# Patient Record
Sex: Female | Born: 1960 | Race: Black or African American | Hispanic: No | Marital: Single | Smoking: Never smoker
Health system: Southern US, Community
[De-identification: ages and names within clinical notes are randomized; demographics above are authoritative.]

## PROBLEM LIST (undated history)

## (undated) HISTORY — PX: ACHILLES TENDON SURGERY: SHX542

---

## 2018-07-16 DIAGNOSIS — Z0000 Encounter for general adult medical examination without abnormal findings: Secondary | ICD-10-CM | POA: Diagnosis not present

## 2018-07-16 DIAGNOSIS — T7840XA Allergy, unspecified, initial encounter: Secondary | ICD-10-CM | POA: Diagnosis not present

## 2018-07-29 DIAGNOSIS — Z1231 Encounter for screening mammogram for malignant neoplasm of breast: Secondary | ICD-10-CM | POA: Diagnosis not present

## 2018-08-04 DIAGNOSIS — Z1211 Encounter for screening for malignant neoplasm of colon: Secondary | ICD-10-CM | POA: Diagnosis not present

## 2018-08-04 DIAGNOSIS — K6389 Other specified diseases of intestine: Secondary | ICD-10-CM | POA: Diagnosis not present

## 2018-08-04 DIAGNOSIS — K641 Second degree hemorrhoids: Secondary | ICD-10-CM | POA: Diagnosis not present

## 2018-08-04 DIAGNOSIS — K5730 Diverticulosis of large intestine without perforation or abscess without bleeding: Secondary | ICD-10-CM | POA: Diagnosis not present

## 2018-09-19 DIAGNOSIS — D649 Anemia, unspecified: Secondary | ICD-10-CM | POA: Diagnosis not present

## 2018-09-19 DIAGNOSIS — K529 Noninfective gastroenteritis and colitis, unspecified: Secondary | ICD-10-CM | POA: Diagnosis not present

## 2019-06-17 DIAGNOSIS — Z1231 Encounter for screening mammogram for malignant neoplasm of breast: Secondary | ICD-10-CM | POA: Diagnosis not present

## 2019-06-17 DIAGNOSIS — R350 Frequency of micturition: Secondary | ICD-10-CM | POA: Diagnosis not present

## 2019-06-25 DIAGNOSIS — R350 Frequency of micturition: Secondary | ICD-10-CM | POA: Diagnosis not present

## 2019-09-03 DIAGNOSIS — Z20828 Contact with and (suspected) exposure to other viral communicable diseases: Secondary | ICD-10-CM | POA: Diagnosis not present

## 2019-09-03 DIAGNOSIS — Z7189 Other specified counseling: Secondary | ICD-10-CM | POA: Diagnosis not present

## 2020-02-06 DIAGNOSIS — L259 Unspecified contact dermatitis, unspecified cause: Secondary | ICD-10-CM | POA: Diagnosis not present

## 2020-02-06 DIAGNOSIS — L590 Erythema ab igne [dermatitis ab igne]: Secondary | ICD-10-CM | POA: Diagnosis not present

## 2020-04-05 ENCOUNTER — Other Ambulatory Visit: Payer: Self-pay

## 2020-04-05 ENCOUNTER — Other Ambulatory Visit: Payer: BC Managed Care – PPO

## 2020-04-05 DIAGNOSIS — Z20822 Contact with and (suspected) exposure to covid-19: Secondary | ICD-10-CM

## 2020-04-06 LAB — SARS-COV-2, NAA 2 DAY TAT

## 2020-04-06 LAB — NOVEL CORONAVIRUS, NAA: SARS-CoV-2, NAA: NOT DETECTED

## 2020-04-23 ENCOUNTER — Emergency Department (HOSPITAL_COMMUNITY)
Admission: EM | Admit: 2020-04-23 | Discharge: 2020-04-23 | Disposition: A | Discharge status: Home / Self Care | Payer: BC Managed Care – PPO | Attending: Emergency Medicine | Admitting: Emergency Medicine

## 2020-04-23 ENCOUNTER — Other Ambulatory Visit: Payer: Self-pay

## 2020-04-23 ENCOUNTER — Encounter (HOSPITAL_COMMUNITY): Payer: Self-pay

## 2020-04-23 DIAGNOSIS — T783XXA Angioneurotic edema, initial encounter: Secondary | ICD-10-CM | POA: Diagnosis not present

## 2020-04-23 DIAGNOSIS — R220 Localized swelling, mass and lump, head: Secondary | ICD-10-CM | POA: Diagnosis not present

## 2020-04-23 MED ORDER — METHYLPREDNISOLONE SODIUM SUCC 125 MG IJ SOLR
125.0000 mg | Freq: Once | INTRAMUSCULAR | Status: AC
  Administered 2020-04-23: 125 mg via INTRAVENOUS
  Filled 2020-04-23: qty 2

## 2020-04-23 MED ORDER — PREDNISONE 20 MG PO TABS: ORAL_TABLET | ORAL | 0 refills | Status: DC

## 2020-04-23 MED ORDER — FAMOTIDINE 20 MG PO TABS
20.0000 mg | ORAL_TABLET | Freq: Once | ORAL | Status: AC
  Administered 2020-04-23: 20 mg via ORAL
  Filled 2020-04-23: qty 1

## 2020-04-23 MED ORDER — DIPHENHYDRAMINE HCL 50 MG/ML IJ SOLN
50.0000 mg | Freq: Once | INTRAMUSCULAR | Status: AC
  Administered 2020-04-23: 50 mg via INTRAVENOUS
  Filled 2020-04-23: qty 1

## 2020-04-23 MED ORDER — DIPHENHYDRAMINE HCL 25 MG PO TABS: 25.0000 mg | ORAL_TABLET | Freq: Four times a day (QID) | ORAL | 0 refills | Status: DC | PRN

## 2020-04-23 MED ORDER — EPINEPHRINE 0.3 MG/0.3ML IJ SOAJ: 0.3000 mg | INTRAMUSCULAR | 1 refills | Status: DC | PRN

## 2020-04-23 MED ORDER — FAMOTIDINE 20 MG PO TABS: 20.0000 mg | ORAL_TABLET | Freq: Two times a day (BID) | ORAL | 0 refills | Status: AC

## 2020-04-23 MED ORDER — EPINEPHRINE 0.3 MG/0.3ML IJ SOAJ: 0.3000 mg | INTRAMUSCULAR | 1 refills | Status: AC | PRN

## 2020-04-23 NOTE — ED Triage Notes (Signed)
Patient arrives with c/o of lip swelling that started on Thursday after a walk in park. Patient went home after her walk and used Abreva, thinking she was developing a cold sore. She has been using benadryl (yesterday and this morning) with no relief. Went to urgent care this morning and she was referred here.   Patient arrives with notable lip swelling and some minor discomfort in her throat (started this morning). Oxygen Saturations 100% on RA.  Patient also states that she developed a rash on her wrist 1 week ago after working in her garden. She was prescribed Methylprednisone for 6 days (today is day 6).

## 2020-04-23 NOTE — Discharge Instructions (Signed)
Please read and follow all provided instructions.  Your diagnoses today include:  1. Angioedema of lips, initial encounter     Tests performed today include:  Vital signs. See below for your results today.   Medications prescribed:   Prednisone - steroid medicine   It is best to take this medication in the morning to prevent sleeping problems. If you are diabetic, monitor your blood sugar closely and stop taking Prednisone if blood sugar is over 300. Take with food to prevent stomach upset.    Benadryl (diphenhydramine) - antihistamine  You can find this medication over-the-counter.   DO NOT exceed:   50mg  Benadryl every 6 hours    Benadryl will make you drowsy. DO NOT drive or perform any activities that require you to be awake and alert if taking this.   Pepcid (famotidine) - antihistamine  You can find this medication over-the-counter.   DO NOT exceed:   20mg  Pepcid every 12 hours   Epi-pen  Inject into thigh as directed if you have a severe reaction that causes throat swelling or any trouble breathing. Call 9-1-1 immediately if you use an Epi-pen. You should be evaluated at a hospital as soon as possible.     Take any prescribed medications only as directed.  Home care instructions:   Follow any educational materials contained in this packet  Follow-up instructions: Please follow-up with your primary care provider in the next 2 days for further evaluation of your symptoms.   Return instructions:   Please return to the Emergency Department if you experience worsening symptoms.   Call 9-1-1 immediately if you have a severe, worsening allergic reaction that involves your lips, mouth, throat or if you have any difficulty breathing. This is a life-threatening emergency.    Please return if you have any other emergent concerns.  Additional Information:  Your vital signs today were: BP 129/81 (BP Location: Left Arm)    Pulse 68    Temp 98 F (36.7 C) (Oral)     Resp 16    Ht 5\' 11"  (1.803 m)    Wt 78 kg    SpO2 100%    BMI 23.99 kg/m  If your blood pressure (BP) was elevated above 135/85 this visit, please have this repeated by your doctor within one month. --------------

## 2020-04-23 NOTE — ED Provider Notes (Signed)
[facility] EMERGENCY DEPARTMENT Provider Note   CSN: [id number] Arrival date & time: 04/23/20  8466     History No chief complaint on file.   Danielle Curry is a 59 y.o. female.  Patient presents to the emergency department today for lip swelling.  Patient denies history of allergies or any chronic medications.  Patient developed a rash on her hands and wrists after working in a garden about a week ago.  She was seen as an outpatient and placed on a Medrol Dosepak.  Today was the final day of the 6-day course.  2 nights ago she noted mild swelling to the bottom right lip.  Yesterday the swelling progressed and began to involve the left side.  She applied Abreva thinking it was a cold sore without improvement.  She also developed a raised area on her left shoulder.  She went to urgent care this morning to be evaluated and she was sent to the emergency department because she also reported some difficulty swallowing.  Patient denies lightheadedness or syncope.  She denies nausea, vomiting, or diarrhea.  She has tried Benadryl without improvement.  No fevers, URI symptoms, or trouble breathing.        No past medical history on file.  There are no problems to display for this patient.   The histories are not reviewed yet. Please review them in the "History" navigator section and refresh this SmartLink.   OB History   No obstetric history on file.     No family history on file.  Social History   Tobacco Use  . Smoking status: Never Smoker  . Smokeless tobacco: Never Used  Substance Use Topics  . Alcohol use: Never  . Drug use: Never    Home Medications Prior to Admission medications   Not on File    Allergies    Patient has no known allergies.  Review of Systems   Review of Systems  Constitutional: Negative for fever.  HENT: Positive for facial swelling and trouble swallowing.   Eyes: Negative for redness.  Respiratory: Negative for shortness  of breath, wheezing and stridor.   Cardiovascular: Negative for chest pain.  Gastrointestinal: Negative for nausea and vomiting.  Musculoskeletal: Negative for myalgias.  Skin: Positive for rash.  Neurological: Negative for light-headedness.  Psychiatric/Behavioral: Negative for confusion.    Physical Exam Updated Vital Signs BP 129/81 (BP Location: Left Arm)   Pulse 68   Temp 98 F (36.7 C) (Oral)   Resp 16   Ht 5\' 11"  (1.803 m)   Wt 78 kg   SpO2 100%   BMI 23.99 kg/m   Physical Exam Vitals and nursing note reviewed.  Constitutional:      General: She is not in acute distress.    Appearance: She is well-developed.  HENT:     Head: Normocephalic and atraumatic.     Right Ear: External ear normal.     Left Ear: External ear normal.     Nose: Nose normal.     Mouth/Throat:     Mouth: Mucous membranes are moist.     Pharynx: Oropharynx is clear.     Comments: Mild angioedema noted. Patient handling secretions.  Eyes:     Conjunctiva/sclera: Conjunctivae normal.  Cardiovascular:     Rate and Rhythm: Normal rate and regular rhythm.     Heart sounds: No murmur heard.   Pulmonary:     Effort: No respiratory distress.     Breath sounds: No wheezing, rhonchi  or rales.  Abdominal:     Palpations: Abdomen is soft.     Tenderness: There is no abdominal tenderness. There is no guarding or rebound.  Musculoskeletal:     Cervical back: Normal range of motion and neck supple.     Right lower leg: No edema.     Left lower leg: No edema.  Skin:    General: Skin is warm and dry.     Findings: No rash.     Comments: 3cm wheal superior L shoulder. Resolving crusting noted to wrist c/w contact dermatitis.   Neurological:     General: No focal deficit present.     Mental Status: She is alert. Mental status is at baseline.     Motor: No weakness.  Psychiatric:        Mood and Affect: Mood normal.     ED Results / Procedures / Treatments   Labs (all labs ordered are listed,  but only abnormal results are displayed) Labs Reviewed - No data to display  EKG None  Radiology No results found.  Procedures Procedures (including critical care time)  Medications Ordered in ED Medications  methylPREDNISolone sodium succinate (SOLU-MEDROL) 125 mg/2 mL injection 125 mg (125 mg Intravenous Given 04/23/20 1026)  diphenhydrAMINE (BENADRYL) injection 50 mg (50 mg Intravenous Given 04/23/20 1024)  famotidine (PEPCID) tablet 20 mg (20 mg Oral Given 04/23/20 1013)    ED Course  I have reviewed the triage vital signs and the nursing notes.  Pertinent labs & imaging results that were available during my care of the patient were reviewed by me and considered in my medical decision making (see chart for details).  Patient seen and examined. Mild angioedema, no sign of impending airway compromise. Will give meds, no role for epi right now. Anticipate d/c if stable and drinking. Unclear etiology, discussed possible need for follow-up with allergist.   Vital signs reviewed and are as follows: BP 129/81 (BP Location: Left Arm)   Pulse 68   Temp 98 F (36.7 C) (Oral)   Resp 16   Ht 5\' 11"  (1.803 m)   Wt 78 kg   SpO2 100%   BMI 23.99 kg/m   12:23 PM Patient discussed with and seen earlier with Dr. .  Patient rechecked.  She reports subjective improvement in the difficulty swallowing.  I observed her swallowing water at bedside with no difficulty.  She is speaking in full sentences without any hoarseness.  I reevaluated her tongue, which does not appear swollen.  Lip swelling appears stable.  Patient has been monitored in the emergency department for 3 and half hours without worsening.  She has tolerated the medications well.  Plan for discharge to home with taper of prednisone, Benadryl and Pepcid x5 days, EpiPen for emergency.  Discussed use of EpiPen and need to call 911 for severe reaction.  Encouraged PCP follow-up next week for recheck and to discuss potential need  for allergist to determine underlying cause.  Encouraged return to the emergency department if she has any acute worsening of her's symptoms including change in voice, difficulty breathing, tongue swelling, vomiting or syncope.  She verbalizes understanding agrees with plan.    MDM Rules/Calculators/A&P                          Patient presents today with angioedema, no prior history, unclear trigger.  Overall symptoms are mild with swelling of the lips.  No intraoral or tongue swelling noted.  Patient with normal voice and clear lungs.  She reports swelling in her throat, but is able to swallow water without any difficulty.  She was monitored in the emergency department for 3 hours after treatment without any signs of worsening.  Subjectively she is feeling a bit better.  No indications for admission or further monitoring at this time.  Patient counseled on expectant management, need for PCP follow-up.   Final Clinical Impression(s) / ED Diagnoses Final diagnoses:  Angioedema of lips, initial encounter    Rx / DC Orders ED Discharge Orders         Ordered    predniSONE (DELTASONE) 20 MG tablet        04/23/20 1219    diphenhydrAMINE (BENADRYL) 25 MG tablet  Every 6 hours PRN        04/23/20 1219    famotidine (PEPCID) 20 MG tablet  2 times daily        04/23/20 1219    EPINEPHrine 0.3 mg/0.3 mL IJ SOAJ injection  As needed,   Status:  Discontinued        04/23/20 1219    EPINEPHrine 0.3 mg/0.3 mL IJ SOAJ injection  As needed        04/23/20 1221           Renne Crigler, PA-C 04/23/20 1226    Melene Plan, DO 04/23/20 1229

## 2020-04-29 DIAGNOSIS — R079 Chest pain, unspecified: Secondary | ICD-10-CM | POA: Diagnosis not present

## 2020-04-29 DIAGNOSIS — R0789 Other chest pain: Secondary | ICD-10-CM | POA: Diagnosis not present

## 2020-04-30 ENCOUNTER — Telehealth: Payer: BC Managed Care – PPO | Admitting: Family

## 2020-04-30 DIAGNOSIS — T7840XD Allergy, unspecified, subsequent encounter: Secondary | ICD-10-CM

## 2020-04-30 NOTE — Progress Notes (Signed)
Based on what you shared with me, I feel your condition warrants further evaluation and I recommend that you be seen for a face to face office visit.  Given you are having a recurrent allergic reaction, you need to be seen face to face. You need to keep a food journal to determine if you can find your trigger. IF YOU HAVE ANY SHORTNESS OF BREATH OR TONGUE YOU NEED TO GO ED. You may also need to see an allergen specialists. Continue your current medications.    NOTE: If you entered your credit card information for this eVisit, you will not be charged. You may see a "hold" on your card for the $35 but that hold will drop off and you will not have a charge processed.   If you are having a true medical emergency please call 911.      For an urgent face to face visit, [facility] has five urgent care centers for your convenience:      NEW:  [facility] at [facility] Directions [phone] [address] [place], [place] [address] . 10 am - 6pm Monday - Friday    [facility] Ur[facility] Center [facility]) Get Driving Directions [phone] [address] [place], [place] [address] . 10 am to 8 pm Monday-Friday . 12 pm to 8 pm [facility] Urgent Care at [facility] Get Driving Directions [phone] [address], [address] [place], [place] [address] . 8 am to 8 pm Monday-Friday . 9 am to 6 pm Saturday . 11 am to 6 pm Sunday     [facility] Urgent Care at [facility] Get Driving Directions  [phone] [address] [place], [place] [address] . 8 am to 8 pm Monday-Friday . 8 am to 4 pm [facility] Urgent Care at [facility] Directions [phone] [address] [place], [place] [address] . 12 pm to 6 pm Monday-Friday      Your e-visit answers were reviewed by a board certified advanced clinical practitioner to complete your personal care  plan.  Thank you for using e-Visits.

## 2020-05-02 DIAGNOSIS — Z13220 Encounter for screening for lipoid disorders: Secondary | ICD-10-CM | POA: Diagnosis not present

## 2020-05-02 DIAGNOSIS — R0789 Other chest pain: Secondary | ICD-10-CM | POA: Diagnosis not present

## 2020-05-02 DIAGNOSIS — Z0000 Encounter for general adult medical examination without abnormal findings: Secondary | ICD-10-CM | POA: Diagnosis not present

## 2020-05-02 DIAGNOSIS — Z823 Family history of stroke: Secondary | ICD-10-CM | POA: Diagnosis not present

## 2020-05-02 DIAGNOSIS — T7840XD Allergy, unspecified, subsequent encounter: Secondary | ICD-10-CM | POA: Diagnosis not present

## 2020-05-16 DIAGNOSIS — L501 Idiopathic urticaria: Secondary | ICD-10-CM | POA: Diagnosis not present

## 2020-05-16 DIAGNOSIS — T783XXD Angioneurotic edema, subsequent encounter: Secondary | ICD-10-CM | POA: Diagnosis not present

## 2020-05-18 DIAGNOSIS — Z20828 Contact with and (suspected) exposure to other viral communicable diseases: Secondary | ICD-10-CM | POA: Diagnosis not present

## 2020-05-30 DIAGNOSIS — D72828 Other elevated white blood cell count: Secondary | ICD-10-CM | POA: Diagnosis not present

## 2020-06-01 ENCOUNTER — Encounter: Payer: Self-pay | Admitting: Cardiovascular Disease

## 2020-06-01 ENCOUNTER — Other Ambulatory Visit: Payer: Self-pay

## 2020-06-01 ENCOUNTER — Ambulatory Visit: Payer: BC Managed Care – PPO | Admitting: Cardiovascular Disease

## 2020-06-01 VITALS — BP 115/71 | HR 68 | Ht 71.0 in | Wt 186.8 lb

## 2020-06-01 DIAGNOSIS — R0789 Other chest pain: Secondary | ICD-10-CM | POA: Diagnosis not present

## 2020-06-01 DIAGNOSIS — Z01812 Encounter for preprocedural laboratory examination: Secondary | ICD-10-CM

## 2020-06-01 DIAGNOSIS — R079 Chest pain, unspecified: Secondary | ICD-10-CM

## 2020-06-01 DIAGNOSIS — K219 Gastro-esophageal reflux disease without esophagitis: Secondary | ICD-10-CM

## 2020-06-01 HISTORY — DX: Other chest pain: R07.89

## 2020-06-01 HISTORY — DX: Gastro-esophageal reflux disease without esophagitis: K21.9

## 2020-06-01 MED ORDER — METOPROLOL TARTRATE 50 MG PO TABS: ORAL_TABLET | ORAL | 0 refills | Status: AC

## 2020-06-01 NOTE — Progress Notes (Signed)
Cardiology Office Note   Date:  06/01/2020   ID:  Danielle Curry, DOB September 24, 1960, MRN [id number]  PCP:  Danielle Officer, PA  Cardiologist:   Danielle Si, MD   No chief complaint on file.   History of Present Illness: Danielle Curry is a 59 y.o. female who is being seen today for the evaluation of chest pain and palpitations at the request of Danielle Curry, Danielle Curry.  In November her mother had a stroke.  She has been [place] and [place] taking care of her mother.  She had an episoe of substernal pain after eating avocado toast.  The discomfort occurred while travelling.  She noted tightness in her chest and felt like she was being stabbed in the chest.  It radiated to her L arm.  There were no associated symptoms.  She noted that it was nonexertional and that she has been under more stress lately taking care of her mother.  It occurred off and on for 10 minute intervals.  The episode have been ongoing intermittently for 2 or 3 hours.  There is no associated shortness of breath, nausea, or diaphoresis.  She was seen in the ED 04/29/2020. She was evaluated in outside hospital and we are unable to see her EKG but it was reportedly unremarkable.  Troponin was less than 0.01.  She was discharged home from the ED and instructed to follow up with cardiology.  Danielle Curry walks for exercise three times per week for at least 40 minutes.  She also does aerobic videos.  She has no exertional symptoms.  She has no edema, orthopnea or PND.  She notes that laying on her L side.  She had palpitations several years ago and was diagnosed with mitral valve prolapse.  However on a subsequent echo she was found not to have mitral valve prolapse.  She reports that she had a heart cath in the past and that her heart arteries were normal.  This was many years ago.  Past Medical History:  Diagnosis Date  . Atypical chest pain 06/01/2020  . GERD (gastroesophageal reflux  disease) 06/01/2020    Past Surgical History:  Procedure Laterality Date  . ACHILLES TENDON SURGERY Right      Current Outpatient Medications  Medication Sig Dispense Refill  . Cetirizine HCl (ZYRTEC ALLERGY) 10 MG CAPS Take 10 mg by mouth 1 day or 1 dose.    Marland Kitchen ELDERBERRY PO Take 1 tablet by mouth daily.    Marland Kitchen EPINEPHrine 0.3 mg/0.3 mL IJ SOAJ injection Inject 0.3 mg into the muscle as needed for anaphylaxis. 1 each 1  . famotidine (PEPCID) 20 MG tablet Take 1 tablet (20 mg total) by mouth 2 (two) times daily. 10 tablet 0  . thiamine (VITAMIN B-1) 50 MG tablet Take 50 mg by mouth daily.    . metoprolol tartrate (LOPRESSOR) 50 MG tablet Take 1 tablet 2 hours prior to CT 1 tablet 0   No current facility-administered medications for this visit.    Allergies:   Patient has no known allergies.    Social History:  The patient  reports that she has never smoked. She has never used smokeless tobacco. She reports that she does not drink alcohol and does not use drugs.   Family History:  The patient's family history includes Heart attack in her paternal aunt; Hypertension in her maternal grandfather, maternal grandmother, mother, paternal grandfather, and paternal grandmother; Stroke in her maternal aunt, maternal uncle,  and mother.    ROS:  Please see the history of present illness.   Otherwise, review of systems are positive for none.   All other systems are reviewed and negative.    PHYSICAL EXAM: VS:  BP 115/71   Pulse 68   Ht 5\' 11"  (1.803 m)   Wt 186 lb 12.8 oz (84.7 kg)   SpO2 99%   BMI 26.05 kg/m  , BMI Body mass index is 26.05 kg/m. GENERAL:  Well appearing HEENT:  Pupils equal round and reactive, fundi not visualized, oral mucosa unremarkable NECK:  No jugular venous distention, waveform within normal limits, carotid upstroke brisk and symmetric, no bruits, no thyromegaly LYMPHATICS:  No cervical adenopathy LUNGS:  Clear to auscultation bilaterally HEART:  RRR.  PMI not  displaced or sustained,S1 and S2 within normal limits, no S3, no S4, no clicks, no rubs, n murmurs ABD:  Flat, positive bowel sounds normal in frequency in pitch, no bruits, no rebound, no guarding, no midline pulsatile mass, no hepatomegaly, no splenomegaly EXT:  2 plus pulses throughout, no edema, no cyanosis no clubbing SKIN:  No rashes no nodules NEURO:  Cranial nerves II through XII grossly intact, motor grossly intact throughout PSYCH:  Cognitively intact, oriented to person place and time    EKG:  EKG is ordered today. The ekg ordered today demonstrates sinus rhythm.  Rate 68 bpm.   Recent Labs: No results found for requested labs within last 8760 hours.   05/30/2020: WBC 5.9, hemoglobin 11.6, hematocrit 36.2, platelets 364 Sodium 138, potassium 4.3, BUN 10, creatinine 0.78 Total cholesterol 220, triglycerides 74, HDL 87, LDL 120  Lipid Panel No results found for: CHOL, TRIG, HDL, CHOLHDL, VLDL, LDLCALC, LDLDIRECT    Wt Readings from Last 3 Encounters:  06/01/20 186 lb 12.8 oz (84.7 kg)  04/23/20 172 lb (78 kg)      ASSESSMENT AND PLAN:  # Atypical chest pain:  Symptoms were atypical and she exercises regularly.   Unlikely to be ischemia.  We will get a coronary CT-A to be sure.  Recommend continued regular exercise and limiting fried foods, fatty foods, red meat, and cheeses.  # CV Disease Prevention:   ASCVD 10-year risk is 2.8%.  Therefore no plans to start a statin unless she has significant plaque on her coronary CT.  We had a discussion about the fact that she is no longer taking aspirin 81 mg.  I do not think this is indicated for her.   Current medicines are reviewed at length with the patient today.  The patient does not have concerns regarding medicines.  The following changes have been made:  no change  Labs/ tests ordered today include:   Orders Placed This Encounter  Procedures  . CT CORONARY MORPH W/CTA COR W/SCORE W/CA W/CM &/OR WO/CM  . CT  CORONARY FRACTIONAL FLOW RESERVE DATA PREP  . CT CORONARY FRACTIONAL FLOW RESERVE FLUID ANALYSIS  . Basic metabolic panel  . EKG 12-Lead     Disposition:   FU with Danielle Curry C. 06/23/20, MD, [facility] as needed     Signed, Angie Piercey C. [facility], MD, [facility]  06/01/2020 1:39 PM    [facility]

## 2020-06-01 NOTE — Patient Instructions (Addendum)
Medication Instructions:  TAKE METOPROLOL 50 MG 2 HOURS PRIOR TO CT  *If you need a refill on your cardiac medications before your next appointment, please call your pharmacy*  Lab Work: BMET 1 WEEK PRIOR TO CT  If you have labs (blood work) drawn today and your tests are completely normal, you will receive your results only by: Marland Kitchen MyChart Message (if you have MyChart) OR . A paper copy in the mail If you have any lab test that is abnormal or we need to change your treatment, we will call you to review the results.  Testing/Procedures: Your physician has requested that you have cardiac CT. Cardiac computed tomography (CT) is a painless test that uses an x-ray machine to take clear, detailed pictures of your heart. For further information please visit [email or web address]. Please follow instruction sheet as given. ONCE INSURANCE HAS BEEN REVIEWED THE OFFICE WILL CALL YOU TO SCHEDULE. IF YOU DO NOT HEAR FROM THE OFFICE IN 2 WEEKS CALL TO FOLLOW UP    Follow-Up: At [facility], you and your health needs are our priority.  As part of our continuing mission to provide you with exceptional heart care, we have created designated Provider Care Teams.  These Care Teams include your primary Cardiologist (physician) and Advanced Practice Providers (APPs -  Physician Assistants and Nurse Practitioners) who all work together to provide you with the care you need, when you need it.  We recommend signing up for the patient portal called "MyChart".  Sign up information is provided on this After Visit Summary.  MyChart is used to connect with patients for Virtual Visits (Telemedicine).  Patients are able to view lab/test results, encounter notes, upcoming appointments, etc.  Non-urgent messages can be sent to your provider as well.   To learn more about what you can do with MyChart, go to [email or web address].    Your next appointment:   AS NEEDED   Other Instructions  Your cardiac CT will be  scheduled at one of the below locations:   [facility] [address] [place], [place] [address] [phone]  [facility] [address] [place], [place] [address] [phone]  If scheduled at [facility], please arrive at the [facility] main entrance of [facility] 30 minutes prior to test start time. Proceed to the [facility] Radiology Department (first floor) to check-in and test prep.  If scheduled at [facility], please arrive 15 mins early for check-in and test prep.  Please follow these instructions carefully (unless otherwise directed):  Hold all erectile dysfunction medications at least 3 days (72 hrs) prior to test.  On the Night Before the Test: . Be sure to Drink plenty of water. . Do not consume any caffeinated/decaffeinated beverages or chocolate 12 hours prior to your test. . Do not take any antihistamines 12 hours prior to your test. . If the patient has contrast allergy: ? Patient will need a prescription for Prednisone and very clear instructions (as follows): 1. Prednisone 50 mg - take 13 hours prior to test 2. Take another Prednisone 50 mg 7 hours prior to test 3. Take another Prednisone 50 mg 1 hour prior to test 4. Take Benadryl 50 mg 1 hour prior to test . Patient must complete all four doses of above prophylactic medications. . Patient will need a ride after test due to Benadryl.  On the Day of the Test: . Drink plenty of water. Do  not drink any water within one hour of the test. . Do not eat any food 4 hours prior to the test. . You may take your regular medications prior to the test.  . Take metoprolol (Lopressor) two hours prior to test. . HOLD Furosemide/Hydrochlorothiazide morning of the test. . FEMALES- please wear underwire-free bra if available  After the Test: . Drink plenty of water. . After receiving IV contrast, you may  experience a mild flushed feeling. This is normal. . On occasion, you may experience a mild rash up to 24 hours after the test. This is not dangerous. If this occurs, you can take Benadryl 25 mg and increase your fluid intake. . If you experience trouble breathing, this can be serious. If it is severe call 911 IMMEDIATELY. If it is mild, please call our office. . If you take any of these medications: Glipizide/Metformin, Avandament, Glucavance, please do not take 48 hours after completing test unless otherwise instructed.   Once we have confirmed authorization from your insurance company, we will call you to set up a date and time for your test. Based on how quickly your insurance processes prior authorizations requests, please allow up to 4 weeks to be contacted for scheduling your Cardiac CT appointment. Be advised that routine Cardiac CT appointments could be scheduled as many as 8 weeks after your provider has ordered it.  For non-scheduling related questions, please contact the cardiac imaging nurse navigator should you have any questions/concerns: Marchia Bond, Cardiac Imaging Nurse Navigator Burley Saver, Interim Cardiac Imaging Nurse [place] [facility] Direct Office Dial: [phone]   For scheduling needs, including cancellations and rescheduling, please call Vivien Rota at [phone], option 3.    Cardiac CT Angiogram A cardiac CT angiogram is a procedure to look at the heart and the area around the heart. It may be done to help find the cause of chest pains or other symptoms of heart disease. During this procedure, a substance called contrast dye is injected into the blood vessels in the area to be checked. A large X-ray machine, called a CT scanner, then takes detailed pictures of the heart and the surrounding area. The procedure is also sometimes called a coronary CT angiogram, coronary artery scanning, or CTA. A cardiac CT angiogram allows the health care  provider to see how well blood is flowing to and from the heart. The health care provider will be able to see if there are any problems, such as:  Blockage or narrowing of the coronary arteries in the heart.  Fluid around the heart.  Signs of weakness or disease in the muscles, valves, and tissues of the heart. Tell a health care provider about:  Any allergies you have. This is especially important if you have had a previous allergic reaction to contrast dye.  All medicines you are taking, including vitamins, herbs, eye drops, creams, and over-the-counter medicines.  Any blood disorders you have.  Any surgeries you have had.  Any medical conditions you have.  Whether you are pregnant or may be pregnant.  Any anxiety disorders, chronic pain, or other conditions you have that may increase your stress or prevent you from lying still. What are the risks? Generally, this is a safe procedure. However, problems may occur, including:  Bleeding.  Infection.  Allergic reactions to medicines or dyes.  Damage to other structures or organs.  Kidney damage from the contrast dye that is used.  Increased risk of cancer from radiation exposure. This risk  is low. Talk with your health care provider about: ? The risks and benefits of testing. ? How you can receive the lowest dose of radiation. What happens before the procedure?  Wear comfortable clothing and remove any jewelry, glasses, dentures, and hearing aids.  Follow instructions from your health care provider about eating and drinking. This may include: ? For 12 hours before the procedure -- avoid caffeine. This includes tea, coffee, soda, energy drinks, and diet pills. Drink plenty of water or other fluids that do not have caffeine in them. Being well hydrated can prevent complications. ? For 4-6 hours before the procedure -- stop eating and drinking. The contrast dye can cause nausea, but this is less likely if your stomach is  empty.  Ask your health care provider about changing or stopping your regular medicines. This is especially important if you are taking diabetes medicines, blood thinners, or medicines to treat problems with erections (erectile dysfunction). What happens during the procedure?   Hair on your chest may need to be removed so that small sticky patches called electrodes can be placed on your chest. These will transmit information that helps to monitor your heart during the procedure.  An IV will be inserted into one of your veins.  You might be given a medicine to control your heart rate during the procedure. This will help to ensure that good images are obtained.  You will be asked to lie on an exam table. This table will slide in and out of the CT machine during the procedure.  Contrast dye will be injected into the IV. You might feel warm, or you may get a metallic taste in your mouth.  You will be given a medicine called nitroglycerin. This will relax or dilate the arteries in your heart.  The table that you are lying on will move into the CT machine tunnel for the scan.  The person running the machine will give you instructions while the scans are being done. You may be asked to: ? Keep your arms above your head. ? Hold your breath. ? Stay very still, even if the table is moving.  When the scanning is complete, you will be moved out of the machine.  The IV will be removed. The procedure may vary among health care providers and hospitals. What can I expect after the procedure? After your procedure, it is common to have:  A metallic taste in your mouth from the contrast dye.  A feeling of warmth.  A headache from the nitroglycerin. Follow these instructions at home:  Take over-the-counter and prescription medicines only as told by your health care provider.  If you are told, drink enough fluid to keep your urine pale yellow. This will help to flush the contrast dye out of your  body.  Most people can return to their normal activities right after the procedure. Ask your health care provider what activities are safe for you.  It is up to you to get the results of your procedure. Ask your health care provider, or the department that is doing the procedure, when your results will be ready.  Keep all follow-up visits as told by your health care provider. This is important. Contact a health care provider if:  You have any symptoms of allergy to the contrast dye. These include: ? Shortness of breath. ? Rash or hives. ? A racing heartbeat. Summary  A cardiac CT angiogram is a procedure to look at the heart and the area around  the heart. It may be done to help find the cause of chest pains or other symptoms of heart disease.  During this procedure, a large X-ray machine, called a CT scanner, takes detailed pictures of the heart and the surrounding area after a contrast dye has been injected into blood vessels in the area.  Ask your health care provider about changing or stopping your regular medicines before the procedure. This is especially important if you are taking diabetes medicines, blood thinners, or medicines to treat erectile dysfunction.  If you are told, drink enough fluid to keep your urine pale yellow. This will help to flush the contrast dye out of your body. This information is not intended to replace advice given to you by your health care provider. Make sure you discuss any questions you have with your health care provider. Document Revised: 03/25/2019 Document Reviewed: 03/25/2019 Elsevier Patient Education  [place].

## 2020-06-13 DIAGNOSIS — Z23 Encounter for immunization: Secondary | ICD-10-CM | POA: Diagnosis not present

## 2020-06-13 DIAGNOSIS — Z0000 Encounter for general adult medical examination without abnormal findings: Secondary | ICD-10-CM | POA: Diagnosis not present

## 2020-06-28 ENCOUNTER — Telehealth (HOSPITAL_COMMUNITY): Payer: Self-pay | Admitting: Emergency Medicine

## 2020-06-28 NOTE — Telephone Encounter (Signed)
Reaching out to patient to offer assistance regarding upcoming cardiac imaging study; pt verbalizes understanding of appt date/time, parking situation and where to check in, pre-test NPO status and medications ordered, and verified current allergies; name and call back number provided for further questions should they arise Rockwell Alexandria RN Navigator Cardiac Imaging [facility] [phone] office [phone] cell   Pt to take metop 2 hr prior to scan

## 2020-06-29 ENCOUNTER — Other Ambulatory Visit: Payer: Self-pay

## 2020-06-29 ENCOUNTER — Ambulatory Visit (HOSPITAL_COMMUNITY)
Admission: RE | Admit: 2020-06-29 | Discharge: 2020-06-29 | Disposition: A | Discharge status: Home / Self Care | Payer: BC Managed Care – PPO | Source: Ambulatory Visit | Attending: Cardiovascular Disease | Admitting: Cardiovascular Disease

## 2020-06-29 DIAGNOSIS — R079 Chest pain, unspecified: Secondary | ICD-10-CM

## 2020-06-29 DIAGNOSIS — Z23 Encounter for immunization: Secondary | ICD-10-CM | POA: Diagnosis not present

## 2020-06-29 MED ORDER — NITROGLYCERIN 0.4 MG SL SUBL
0.8000 mg | SUBLINGUAL_TABLET | Freq: Once | SUBLINGUAL | Status: AC
  Administered 2020-06-29: 0.8 mg via SUBLINGUAL

## 2020-06-29 MED ORDER — NITROGLYCERIN 0.4 MG SL SUBL
SUBLINGUAL_TABLET | SUBLINGUAL | Status: AC
  Filled 2020-06-29: qty 2

## 2020-06-29 MED ORDER — IOHEXOL 350 MG/ML SOLN
80.0000 mL | Freq: Once | INTRAVENOUS | Status: AC | PRN
  Administered 2020-06-29: 80 mL via INTRAVENOUS

## 2020-07-29 ENCOUNTER — Other Ambulatory Visit: Payer: BC Managed Care – PPO

## 2020-07-29 DIAGNOSIS — Z20822 Contact with and (suspected) exposure to covid-19: Secondary | ICD-10-CM

## 2020-07-30 LAB — NOVEL CORONAVIRUS, NAA: SARS-CoV-2, NAA: NOT DETECTED

## 2020-07-30 LAB — SARS-COV-2, NAA 2 DAY TAT

## 2020-08-09 DIAGNOSIS — Z1231 Encounter for screening mammogram for malignant neoplasm of breast: Secondary | ICD-10-CM | POA: Diagnosis not present

## 2020-08-10 DIAGNOSIS — Z03818 Encounter for observation for suspected exposure to other biological agents ruled out: Secondary | ICD-10-CM | POA: Diagnosis not present

## 2020-09-21 DIAGNOSIS — Z23 Encounter for immunization: Secondary | ICD-10-CM | POA: Diagnosis not present

## 2020-09-21 DIAGNOSIS — J40 Bronchitis, not specified as acute or chronic: Secondary | ICD-10-CM | POA: Diagnosis not present

## 2020-10-21 DIAGNOSIS — Z01419 Encounter for gynecological examination (general) (routine) without abnormal findings: Secondary | ICD-10-CM | POA: Diagnosis not present

## 2020-10-21 DIAGNOSIS — Z124 Encounter for screening for malignant neoplasm of cervix: Secondary | ICD-10-CM | POA: Diagnosis not present

## 2020-11-14 DIAGNOSIS — L501 Idiopathic urticaria: Secondary | ICD-10-CM | POA: Diagnosis not present

## 2021-02-08 DIAGNOSIS — Z9189 Other specified personal risk factors, not elsewhere classified: Secondary | ICD-10-CM | POA: Diagnosis not present

## 2021-02-08 DIAGNOSIS — Z1152 Encounter for screening for COVID-19: Secondary | ICD-10-CM | POA: Diagnosis not present

## 2021-05-15 DIAGNOSIS — L501 Idiopathic urticaria: Secondary | ICD-10-CM | POA: Diagnosis not present

## 2021-05-15 DIAGNOSIS — R059 Cough, unspecified: Secondary | ICD-10-CM | POA: Diagnosis not present

## 2021-05-29 DIAGNOSIS — R1084 Generalized abdominal pain: Secondary | ICD-10-CM | POA: Diagnosis not present

## 2021-05-29 DIAGNOSIS — R140 Abdominal distension (gaseous): Secondary | ICD-10-CM | POA: Diagnosis not present

## 2021-07-12 ENCOUNTER — Ambulatory Visit (INDEPENDENT_AMBULATORY_CARE_PROVIDER_SITE_OTHER): Payer: 59 | Admitting: Orthopedic Surgery

## 2021-07-12 ENCOUNTER — Ambulatory Visit: Payer: Self-pay

## 2021-07-12 ENCOUNTER — Other Ambulatory Visit: Payer: Self-pay

## 2021-07-12 DIAGNOSIS — M25561 Pain in right knee: Secondary | ICD-10-CM | POA: Diagnosis not present

## 2021-07-14 ENCOUNTER — Encounter: Payer: Self-pay | Admitting: Orthopedic Surgery

## 2021-07-14 NOTE — Progress Notes (Signed)
Office Visit Note   Patient: Danielle Curry           Date of Birth: 1961-05-07           MRN: [id number] Visit Date: 07/12/2021 Requested by: Delma Officer, PA [address] [place],  [place] [address] PCP: Delma Officer, [place]  Subjective: Chief Complaint  Patient presents with   Right Knee - Pain    HPI: Danielle Curry is a 60 year old patient with right knee pain.  Been going on several years.  Localizes pain discretely to the superior aspect of the patella.  Reports some pain with squats.  No weakness giving way locking stiffness and no waking from sleep at night.  No prior knee surgery.  Reports pain going down stairs.  Denies any swelling.  She does workout about 3 times a week.  No pain walking around.  Denies any mechanical symptoms.              ROS: All systems reviewed are negative as they relate to the chief complaint within the history of present illness.  Patient denies  fevers or chills.   Assessment & Plan: Visit Diagnoses:  1. 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  Ultrasound exam demonstrates a very small amount of tenderness is present.  Recommended stretching of the quad along with eccentric strengthening.  No indication for any type of further imaging at this time.  Should be a self-limited process.  Local topical treatment with anti-inflammatories also discussed.  Follow-up as needed.  Follow-Up Instructions: Return if symptoms worsen or fail to improve.   Orders:  Orders Placed This Encounter  Procedures   XR Knee 1-2 Views Right   No orders of the defined types were placed in this encounter.     Procedures: No procedures performed   Clinical Data: No additional findings.  Objective: Vital Signs: There were no vitals taken for this visit.  Physical Exam:   Constitutional: Patient appears well-developed HEENT:  Head: Normocephalic Eyes:EOM are  normal Neck: Normal range of motion Cardiovascular: Normal rate Pulmonary/chest: Effort normal Neurologic: Patient is alert Skin: Skin is warm Psychiatric: Patient has normal mood and affect   Ortho Exam: Ortho exam demonstrates full active and passive range of motion of the right knee.  She has excellent flexibility and no real quad tightness.  Does have local tenderness at the quad attachment to the patella.  Collateral crucial ligaments are stable.  No masses lymphadenopathy or skin changes noted in that right knee region.  No groin pain with internal ex rotation of the leg.  Specialty Comments:  No specialty comments available.  Imaging: No results found.   PMFS History: Patient Active Problem List   Diagnosis Date Noted   Atypical chest pain 06/01/2020   GERD (gastroesophageal reflux disease) 06/01/2020   Past Medical History:  Diagnosis Date   Atypical chest pain 06/01/2020   GERD (gastroesophageal reflux disease) 06/01/2020    Family History  Problem Relation Age of Onset   Stroke Mother    Hypertension Mother    Stroke Maternal Aunt    Stroke Maternal Uncle    Heart attack Paternal Aunt    Hypertension Maternal Grandmother    Hypertension Maternal Grandfather    Hypertension Paternal Grandmother    Hypertension Paternal Grandfather     Past Surgical History:  Procedure Laterality Date   ACHILLES TENDON SURGERY Right  Social History   Occupational History   Not on file  Tobacco Use   Smoking status: Never   Smokeless tobacco: Never  Substance and Sexual Activity   Alcohol use: Never   Drug use: Never   Sexual activity: Not on file

## 2022-09-15 IMAGING — CT CT HEART MORP W/ CTA COR W/ SCORE W/ CA W/CM &/OR W/O CM
4 of 7 series · 8 of 20 positions shown, 9 images · IV contrast (APPLIED)
Comparison: None.
COMPARISON: None.

Addendum:
EXAM:
OVER-READ INTERPRETATION  CT CHEST

The following report is an over-read performed by radiologist Dr.
Gibson Cordes [REDACTED] on 06/29/2020. This
over-read does not include interpretation of cardiac or coronary
anatomy or pathology. The coronary calcium score/coronary CTA
interpretation by the cardiologist is attached.
CLINICAL DATA: 59F with atypical chest pain.
Cardiac/Coronary  CT
TECHNIQUE: The patient was scanned on a Phillips Force scanner.

[Series 6: best diast 70 % · axial · 0.38mm/px · z∈[-237,-195]mm · 2 of 318 slices shown]
[im 106/318  vessel]
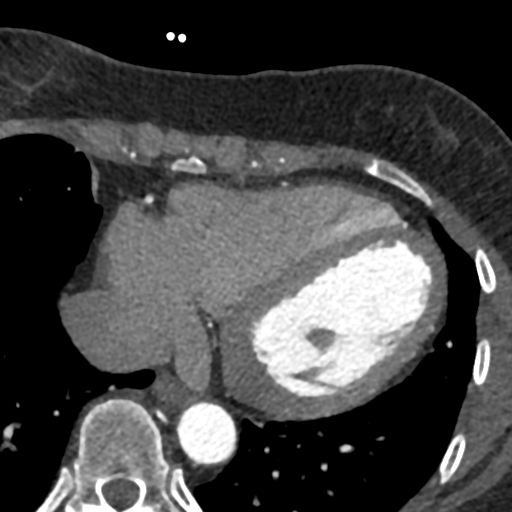
[im 212/318  vessel]
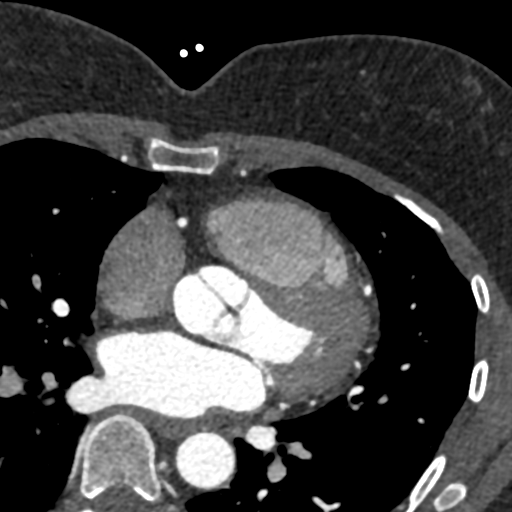

[Series 7: best syst · axial · 0.38mm/px · z∈[-237,-195]mm · 2 of 318 slices shown, 3 images]
[im 106/318  vessel]
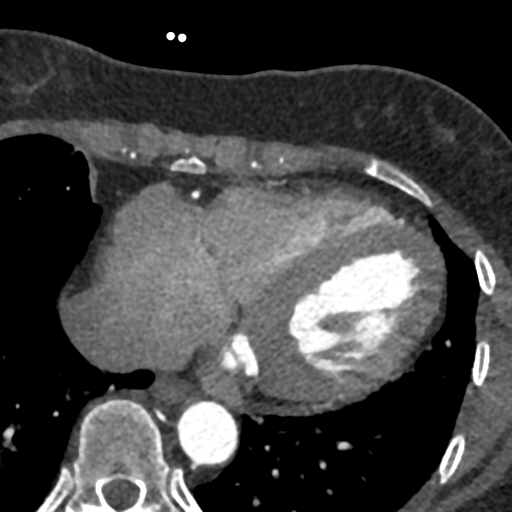
[im 106/318  lung]
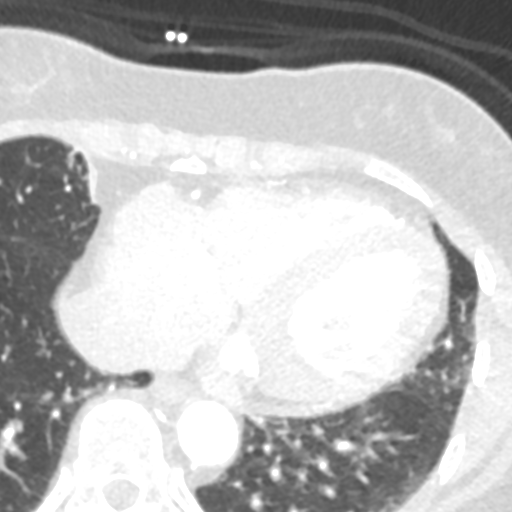
[im 212/318  vessel]
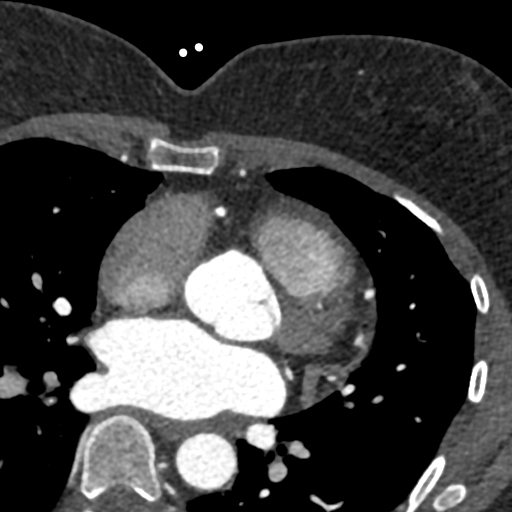

[Series 8: ts diast sharp 70 % · axial · 0.38mm/px · z∈[-237,-195]mm · 2 of 318 slices shown]
[im 106/318  lung]
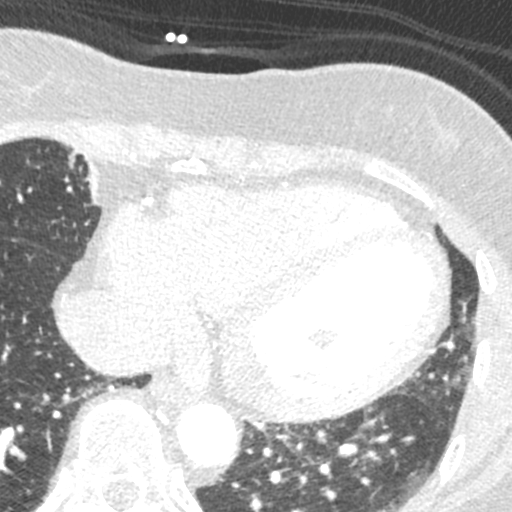
[im 212/318  lung]
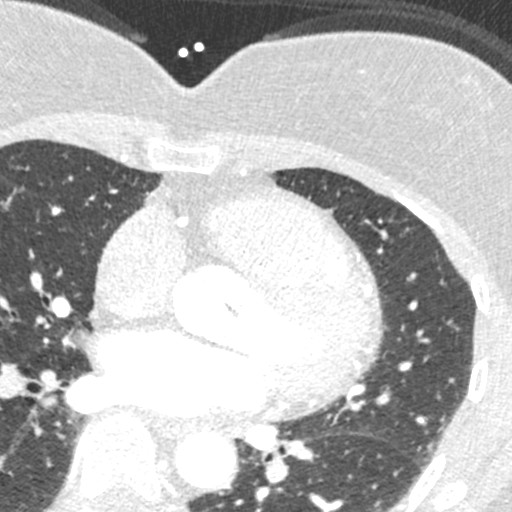

[Series 9: ts syst sharp · axial · 0.38mm/px · z∈[-237,-195]mm · 2 of 318 slices shown]
[im 106/318  lung]
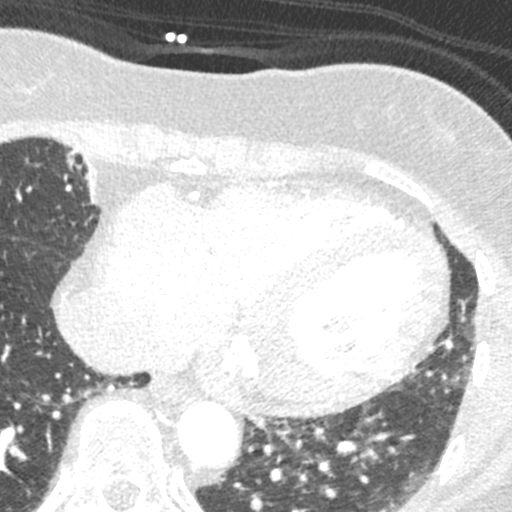
[im 212/318  lung]
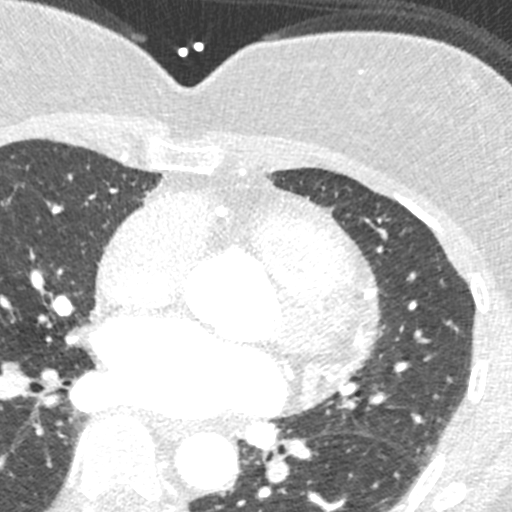

[8 of 20 positions shown; findings below may reference images not displayed]

FINDINGS: Within the visualized portions of the thorax there are no suspicious
appearing pulmonary nodules or masses, there is no acute
consolidative airspace disease, no pleural effusions, no
pneumothorax and no lymphadenopathy. Visualized portions of the
upper abdomen are unremarkable. There are no aggressive appearing
lytic or blastic lesions noted in the visualized portions of the
skeleton.
IMPRESSION: No significant incidental noncardiac findings.
FINDINGS: A 120 kV prospective scan was triggered in the descending thoracic
aorta at 111 HU's. Axial non-contrast 3 mm slices were carried out
through the heart. The data set was analyzed on a dedicated work
station and scored using the Agatson method. Gantry rotation speed
was 250 msecs and collimation was .6 mm. No beta blockade and 0.8 mg
of sl NTG was given. The 3D data set was reconstructed in 5%
intervals of the 67-82 % of the R-R cycle. Diastolic phases were
analyzed on a dedicated work station using MPR, MIP and VRT modes.
The patient received 80 cc of contrast.

Aorta: Normal size. Ascending aorta 3.3 cm. No calcifications. No
dissection.

Aortic Valve:  Trileaflet.  No calcifications.

Coronary Arteries:  Normal coronary origin.  Right dominance.

RCA is a large dominant artery that gives rise to PDA and PLVB.
There is no plaque.

Left main is a large artery that gives rise to LAD and LCX arteries.

LAD is a large vessel that has no plaque. There is a large diagonal
without plaque.

LCX is a non-dominant artery that gives rise to one large OM1 and
one small OM2 branch. There is no plaque.

Other findings:

Normal pulmonary vein drainage into the left atrium.

Normal let atrial appendage without a thrombus.

Normal size of the pulmonary artery.
IMPRESSION: 1. Coronary calcium score of 0. This was 0 percentile for age and
sex matched control.

2. Normal coronary origin with right dominance.

3. No evidence of CAD.

4. Consider non-cardiac causes of chest pain.

*** End of Addendum ***
EXAM:
OVER-READ INTERPRETATION  CT CHEST

The following report is an over-read performed by radiologist Dr.
Gibson Cordes [REDACTED] on 06/29/2020. This
over-read does not include interpretation of cardiac or coronary
anatomy or pathology. The coronary calcium score/coronary CTA
interpretation by the cardiologist is attached.
FINDINGS: Within the visualized portions of the thorax there are no suspicious
appearing pulmonary nodules or masses, there is no acute
consolidative airspace disease, no pleural effusions, no
pneumothorax and no lymphadenopathy. Visualized portions of the
upper abdomen are unremarkable. There are no aggressive appearing
lytic or blastic lesions noted in the visualized portions of the
skeleton.
IMPRESSION: No significant incidental noncardiac findings.

## 2023-05-09 ENCOUNTER — Other Ambulatory Visit: Payer: Self-pay | Admitting: Gastroenterology

## 2023-05-09 DIAGNOSIS — R10815 Periumbilic abdominal tenderness: Secondary | ICD-10-CM

## 2023-05-09 DIAGNOSIS — R1084 Generalized abdominal pain: Secondary | ICD-10-CM

## 2023-05-28 ENCOUNTER — Ambulatory Visit
Admission: RE | Admit: 2023-05-28 | Discharge: 2023-05-28 | Disposition: A | Discharge status: Home / Self Care | Payer: BC Managed Care – PPO | Source: Ambulatory Visit | Attending: Gastroenterology | Admitting: Gastroenterology

## 2023-05-28 DIAGNOSIS — R1084 Generalized abdominal pain: Secondary | ICD-10-CM

## 2023-05-28 DIAGNOSIS — R10815 Periumbilic abdominal tenderness: Secondary | ICD-10-CM

## 2023-05-28 MED ORDER — IOPAMIDOL (ISOVUE-370) INJECTION 76%
80.0000 mL | Freq: Once | INTRAVENOUS | Status: AC | PRN
  Administered 2023-05-28: 80 mL via INTRAVENOUS

## 2023-12-12 ENCOUNTER — Other Ambulatory Visit: Payer: Self-pay | Admitting: Gastroenterology

## 2023-12-12 DIAGNOSIS — R1084 Generalized abdominal pain: Secondary | ICD-10-CM

## 2023-12-12 DIAGNOSIS — R1030 Lower abdominal pain, unspecified: Secondary | ICD-10-CM

## 2023-12-23 ENCOUNTER — Ambulatory Visit
Admission: RE | Admit: 2023-12-23 | Discharge: 2023-12-23 | Disposition: A | Discharge status: Home / Self Care | Source: Ambulatory Visit | Attending: Gastroenterology | Admitting: Gastroenterology

## 2023-12-23 DIAGNOSIS — R1084 Generalized abdominal pain: Secondary | ICD-10-CM

## 2023-12-23 DIAGNOSIS — R1030 Lower abdominal pain, unspecified: Secondary | ICD-10-CM
